# Patient Record
Sex: Female | Born: 1990 | Race: Black or African American | Hispanic: No | Marital: Single | Smoking: Current some day smoker
Health system: Southern US, Community
[De-identification: ages and names within clinical notes are randomized; demographics above are authoritative.]

---

## 2013-03-04 ENCOUNTER — Encounter (HOSPITAL_COMMUNITY): Payer: Self-pay | Admitting: Emergency Medicine

## 2013-03-04 DIAGNOSIS — Z3202 Encounter for pregnancy test, result negative: Secondary | ICD-10-CM | POA: Insufficient documentation

## 2013-03-04 DIAGNOSIS — R112 Nausea with vomiting, unspecified: Secondary | ICD-10-CM | POA: Insufficient documentation

## 2013-03-04 DIAGNOSIS — F17200 Nicotine dependence, unspecified, uncomplicated: Secondary | ICD-10-CM | POA: Insufficient documentation

## 2013-03-04 DIAGNOSIS — R1084 Generalized abdominal pain: Secondary | ICD-10-CM | POA: Insufficient documentation

## 2013-03-04 DIAGNOSIS — R630 Anorexia: Secondary | ICD-10-CM | POA: Insufficient documentation

## 2013-03-04 LAB — CBC WITH DIFFERENTIAL/PLATELET
Basophils Relative: 0 % (ref 0–1)
HCT: 38.5 % (ref 36.0–46.0)
Hemoglobin: 13.1 g/dL (ref 12.0–15.0)
Lymphocytes Relative: 26 % (ref 12–46)
Lymphs Abs: 2.6 10*3/uL (ref 0.7–4.0)
MCHC: 34 g/dL (ref 30.0–36.0)
Monocytes Absolute: 0.4 10*3/uL (ref 0.1–1.0)
Monocytes Relative: 4 % (ref 3–12)
Neutro Abs: 7.1 10*3/uL (ref 1.7–7.7)
Neutrophils Relative %: 69 % (ref 43–77)
Platelets: 286 10*3/uL (ref 150–400)
RBC: 4.29 MIL/uL (ref 3.87–5.11)
WBC: 10.3 10*3/uL (ref 4.0–10.5)

## 2013-03-04 LAB — COMPREHENSIVE METABOLIC PANEL
ALT: 15 U/L (ref 0–35)
BUN: 14 mg/dL (ref 6–23)
CO2: 23 mEq/L (ref 19–32)
Calcium: 9.6 mg/dL (ref 8.4–10.5)
Creatinine, Ser: 1.19 mg/dL — ABNORMAL HIGH (ref 0.50–1.10)
GFR calc Af Amer: 76 mL/min — ABNORMAL LOW (ref >90)
GFR calc non Af Amer: 65 mL/min — ABNORMAL LOW (ref >90)
Glucose, Bld: 132 mg/dL — ABNORMAL HIGH (ref 70–99)
Sodium: 138 mEq/L (ref 135–145)
Total Protein: 7.6 g/dL (ref 6.0–8.3)

## 2013-03-04 MED ORDER — ONDANSETRON HCL 4 MG/2ML IJ SOLN
4.0000 mg | Freq: Once | INTRAMUSCULAR | Status: AC
  Administered 2013-03-04: 4 mg via INTRAVENOUS
  Filled 2013-03-04: qty 2

## 2013-03-04 NOTE — ED Notes (Signed)
Presents with generalized abdominal pain, nausea and vomiting began at 6 pm. Dry heaving and unable to hold fluids down. Denies diarrhea.

## 2013-03-05 ENCOUNTER — Encounter (HOSPITAL_COMMUNITY): Payer: Self-pay | Admitting: Emergency Medicine

## 2013-03-05 ENCOUNTER — Emergency Department (HOSPITAL_COMMUNITY)
Admission: EM | Admit: 2013-03-05 | Discharge: 2013-03-05 | Disposition: A | Discharge status: Home / Self Care | Payer: Managed Care, Other (non HMO) | Attending: Emergency Medicine | Admitting: Emergency Medicine

## 2013-03-05 ENCOUNTER — Emergency Department (HOSPITAL_COMMUNITY): Payer: Managed Care, Other (non HMO)

## 2013-03-05 DIAGNOSIS — R112 Nausea with vomiting, unspecified: Secondary | ICD-10-CM

## 2013-03-05 DIAGNOSIS — R109 Unspecified abdominal pain: Secondary | ICD-10-CM

## 2013-03-05 LAB — URINALYSIS, ROUTINE W REFLEX MICROSCOPIC
Glucose, UA: NEGATIVE mg/dL
Ketones, ur: >80 mg/dL — AB
Leukocytes, UA: NEGATIVE
Nitrite: NEGATIVE
pH: 5.5 (ref 5.0–8.0)

## 2013-03-05 LAB — URINE MICROSCOPIC-ADD ON

## 2013-03-05 LAB — POCT PREGNANCY, URINE: Preg Test, Ur: NEGATIVE

## 2013-03-05 LAB — PREGNANCY, URINE: Preg Test, Ur: NEGATIVE

## 2013-03-05 MED ORDER — SODIUM CHLORIDE 0.9 % IV BOLUS (SEPSIS)
1000.0000 mL | Freq: Once | INTRAVENOUS | Status: AC
  Administered 2013-03-05: 1000 mL via INTRAVENOUS

## 2013-03-05 MED ORDER — IOHEXOL 300 MG/ML  SOLN
100.0000 mL | Freq: Once | INTRAMUSCULAR | Status: AC | PRN
  Administered 2013-03-05: 100 mL via INTRAVENOUS

## 2013-03-05 MED ORDER — IOHEXOL 300 MG/ML  SOLN
20.0000 mL | INTRAMUSCULAR | Status: AC
  Administered 2013-03-05: 25 mL via ORAL

## 2013-03-05 MED ORDER — METOCLOPRAMIDE HCL 5 MG/ML IJ SOLN
10.0000 mg | Freq: Once | INTRAMUSCULAR | Status: AC
  Administered 2013-03-05: 10 mg via INTRAVENOUS
  Filled 2013-03-05: qty 2

## 2013-03-05 MED ORDER — ONDANSETRON 8 MG PO TBDP
ORAL_TABLET | ORAL | Status: AC
Start: 2013-03-05 — End: ?

## 2013-03-05 NOTE — ED Notes (Signed)
EDPA at [facility], speaking with & updating pt/family about plan, options, labs.

## 2013-03-05 NOTE — ED Notes (Signed)
Pt sleeping, NAD, calm, father at [facility], CT notified of "pt ready for CT".

## 2013-03-05 NOTE — ED Notes (Signed)
Back from CT, ambulatory to b/r, al;ert, NAD, calm, steady gait.

## 2013-03-05 NOTE — ED Provider Notes (Signed)
CSN: [id number]     Arrival date & time 03/04/13  2057 History   First MD Initiated Contact with Patient 03/05/13 0034     Chief Complaint  Patient presents with  . Abdominal Pain   HPI  History provided by the patient. Patient is a 22 year old female with no significant PMH who presents with complaints of general abdominal pains and cramps as well as nausea and vomiting. Patient reports having decreased appetite all day. Around 6 PM this evening she had occasional sharp abdominal pains primarily around the periumbilical area. This was followed by multiple episodes of nausea and vomiting. She reports that she's been unable to keep down any fluids and has continued vomiting or dry heaving. She did not use any medications or treatment for symptoms. Denies any associated diarrhea. Denies any recent constipation. She traveled within [place] for the holidays but denies any sick contacts. Denies any associated fever, chills or sweats. No urinary complaints. No vaginal bleeding vaginal discharge.     History reviewed. No pertinent past medical history. No past surgical history on file. No family history on file. History  Substance Use Topics  . Smoking status: Current Some Day Smoker    Types: Cigarettes  . Smokeless tobacco: Not on file  . Alcohol Use: Yes   OB History   Grav Para Term Preterm Abortions TAB SAB Ect Mult Living                 Review of Systems  Constitutional: Positive for appetite change. Negative for fever, chills and diaphoresis.  Respiratory: Negative for cough.   Gastrointestinal: Positive for nausea, vomiting and abdominal pain. Negative for diarrhea and constipation.  Genitourinary: Negative for dysuria, frequency, hematuria, flank pain, vaginal bleeding and vaginal discharge.  All other systems reviewed and are negative.    Allergies  Review of patient's allergies indicates no known allergies.  Home Medications  No current outpatient prescriptions  on file. BP 125/68  Pulse 81  Temp(Src) 97.8 F (36.6 C) (Oral)  Resp 18  SpO2 100%  LMP 02/07/2013 Physical Exam  Nursing note and vitals reviewed. Constitutional: She is oriented to person, place, and time. She appears well-developed and well-nourished. No distress.  HENT:  Head: Normocephalic.  Cardiovascular: Normal rate and regular rhythm.   Pulmonary/Chest: Effort normal and breath sounds normal. No respiratory distress. She has no wheezes. She has no rales.  Abdominal: Soft. Bowel sounds are normal. She exhibits no distension and no mass. There is tenderness in the right lower quadrant and periumbilical area. There is no rebound and no guarding.  Neurological: She is alert and oriented to person, place, and time.  Skin: Skin is warm and dry.  Psychiatric: She has a normal mood and affect. Her behavior is normal.    ED Course  Procedures   DIAGNOSTIC STUDIES: Oxygen Saturation is 100% on room air.    COORDINATION OF CARE:  Nursing notes reviewed. Vital signs reviewed. Initial pt interview and examination performed.   12:38 AM-patient seen and evaluated. She is resting appears comfortable in no acute distress. Patient with mild diffuse abdominal tenderness however greater in the right lower quadrant. No peritoneal signs. Discussed work up plan with pt at bedside, which includes lab testing, UA and reevaluation. Pt agrees with plan.   1:20 AM on recheck of the patient she continues to have some focal tenderness on abdominal exam and right lower quadrant. No rebounding or peritoneal signs. She has normal white count. Discussed options for CT  scan at this time patient does wish to rule out possible appendicitis.    patient having some improvement of symptoms. Lab testing and CT scan unremarkable. This time feel patient's symptoms are most likely cause for viral gastroenteritis-type process. She is able to be discharged this time with strict return precautions.   Treatment  plan initiated: Medications  ondansetron [facility]) injection 4 mg (4 mg Intravenous Given 03/04/13 2122)  ondansetron [facility]) injection 4 mg (4 mg Intravenous Given 03/04/13 2204)   Results for orders placed during the hospital encounter of 03/05/13  COMPREHENSIVE METABOLIC PANEL      Result Value Range   Sodium 138  135 - 145 mEq/L   Potassium 3.3 (*) 3.5 - 5.1 mEq/L   Chloride 101  96 - 112 mEq/L   CO2 23  19 - 32 mEq/L   Glucose, Bld 132 (*) 70 - 99 mg/dL   BUN 14  6 - 23 mg/dL   Creatinine, Ser 4.09 (*) 0.50 - 1.10 mg/dL   Calcium 9.6  8.4 - 81.1 mg/dL   Total Protein 7.6  6.0 - 8.3 g/dL   Albumin 4.3  3.5 - 5.2 g/dL   AST 20  0 - 37 U/L   ALT 15  0 - 35 U/L   Alkaline Phosphatase 71  39 - 117 U/L   Total Bilirubin 0.6  0.3 - 1.2 mg/dL   GFR calc non Af Amer 65 (*) >90 mL/min   GFR calc Af Amer 76 (*) >90 mL/min  CBC WITH DIFFERENTIAL      Result Value Range   WBC 10.3  4.0 - 10.5 K/uL   RBC 4.29  3.87 - 5.11 MIL/uL   Hemoglobin 13.1  12.0 - 15.0 g/dL   HCT 91.4  78.2 - 95.6 %   MCV 89.7  78.0 - 100.0 fL   MCH 30.5  26.0 - 34.0 pg   MCHC 34.0  30.0 - 36.0 g/dL   RDW 21.3  08.6 - 57.8 %   Platelets 286  150 - 400 K/uL   Neutrophils Relative % 69  43 - 77 %   Neutro Abs 7.1  1.7 - 7.7 K/uL   Lymphocytes Relative 26  12 - 46 %   Lymphs Abs 2.6  0.7 - 4.0 K/uL   Monocytes Relative 4  3 - 12 %   Monocytes Absolute 0.4  0.1 - 1.0 K/uL   Eosinophils Relative 1  0 - 5 %   Eosinophils Absolute 0.1  0.0 - 0.7 K/uL   Basophils Relative 0  0 - 1 %   Basophils Absolute 0.0  0.0 - 0.1 K/uL  PREGNANCY, URINE      Result Value Range   Preg Test, Ur NEGATIVE  NEGATIVE  URINALYSIS, ROUTINE W REFLEX MICROSCOPIC      Result Value Range   Color, Urine YELLOW  YELLOW   APPearance CLOUDY (*) CLEAR   Specific Gravity, Urine 1.027  1.005 - 1.030   pH 5.5  5.0 - 8.0   Glucose, UA NEGATIVE  NEGATIVE mg/dL   Hgb urine dipstick LARGE (*) NEGATIVE   Bilirubin Urine NEGATIVE  NEGATIVE    Ketones, ur >80 (*) NEGATIVE mg/dL   Protein, ur NEGATIVE  NEGATIVE mg/dL   Urobilinogen, UA 0.2  0.0 - 1.0 mg/dL   Nitrite NEGATIVE  NEGATIVE   Leukocytes, UA NEGATIVE  NEGATIVE  URINE MICROSCOPIC-ADD ON      Result Value Range   Squamous Epithelial / LPF MANY (*)  RARE   RBC / HPF TOO NUMEROUS TO COUNT  <3 RBC/hpf   Bacteria, UA RARE  RARE  POCT PREGNANCY, URINE      Result Value Range   Preg Test, Ur NEGATIVE  NEGATIVE      Imaging Review Ct Abdomen Pelvis W Contrast  03/05/2013   CLINICAL DATA:  Generalized abdominal pain and vomiting.  EXAM: CT ABDOMEN AND PELVIS WITH CONTRAST  TECHNIQUE: Multidetector CT imaging of the abdomen and pelvis was performed using the standard protocol following bolus administration of intravenous contrast.  CONTRAST:  OMNIPAQUE IOHEXOL 300 MG/ML  SOLN  COMPARISON:  None available for comparison at time of study interpretation.  FINDINGS: Included view of the lung bases are clear. Visualized heart and pericardium are unremarkable.  The liver demonstrates minimal hypodensity about the falciform ligament suggesting focal fatty infiltration is otherwise unremarkable. Spleen, gallbladder, pancreas and adrenal glands are unremarkable.  The stomach, small and large bowel are normal in course and caliber without inflammatory changes. Normal contrast filled appendix. Trace free fluid in the pelvis is likely physiologic. No intraperitoneal free air.  Kidneys are orthotopic, demonstrating symmetric enhancement without nephrolithiasis, hydronephrosis or renal masses. The unopacified ureters are normal in course and caliber. Delayed imaging through the kidneys demonstrates symmetric prompt excretion to the proximal urinary collecting system. Urinary bladder is partially distended and unremarkable.  Great vessels are normal in course and caliber. No lymphadenopathy by CT size criteria. Internal reproductive organs are unremarkable. The soft tissues and included osseous  structures are nonsuspicious.  IMPRESSION: No acute intra-abdominal or pelvic process.  Normal appendix.   Electronically Signed   By: Awilda Metro   On: 03/05/2013 03:57      MDM   1. Abdominal pain   2. Nausea & vomiting         Angus Seller, [place] 03/06/13 [phone]

## 2013-03-05 NOTE — ED Notes (Signed)
C/o L flank area pain, also some RLQ tenderness with palpation, denies nausea at this time. Alert, NAD, calm, interactive, resps e/u, speaking in clear complete sentences.

## 2013-03-05 NOTE — ED Notes (Signed)
To CT via stretcher

## 2013-03-17 NOTE — ED Provider Notes (Signed)
Medical screening examination/treatment/procedure(s) were performed by non-physician practitioner and as supervising physician I was immediately available for consultation/collaboration.     Brandt LoosenJulie Manly, MD 03/17/13 0040

## 2014-12-05 IMAGING — CT CT ABD-PELV W/ CM
2 of 4 series · 14 of 46 positions shown, 16 images · IV contrast (APPLIED)
Comparison: None available for comparison at time of study
interpretation.

CLINICAL DATA: Generalized abdominal pain and vomiting.

EXAM:
CT ABDOMEN AND PELVIS WITH CONTRAST
TECHNIQUE: Multidetector CT imaging of the abdomen and pelvis was performed
using the standard protocol following bolus administration of
intravenous contrast.
CONTRAST:  100mL OMNIPAQUE IOHEXOL 300 MG/ML  SOLN

[Series 2: abd/ pelvis 5.0 i30f 1 · axial · 0.60mm/px · z∈[+916,+1301]mm · 11 of 93 slices shown, 13 images]
[im 8/93  soft-tissue]
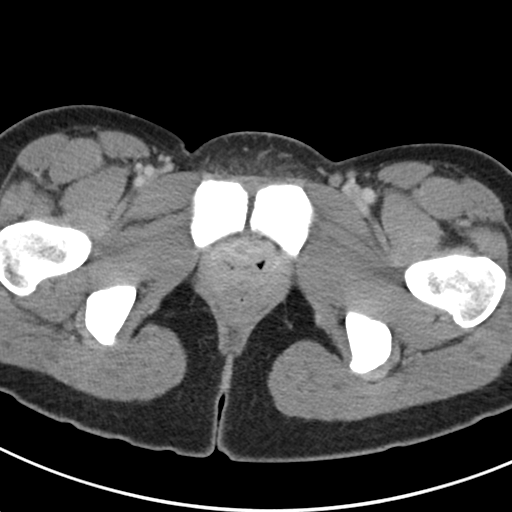
[im 8/93  bone]
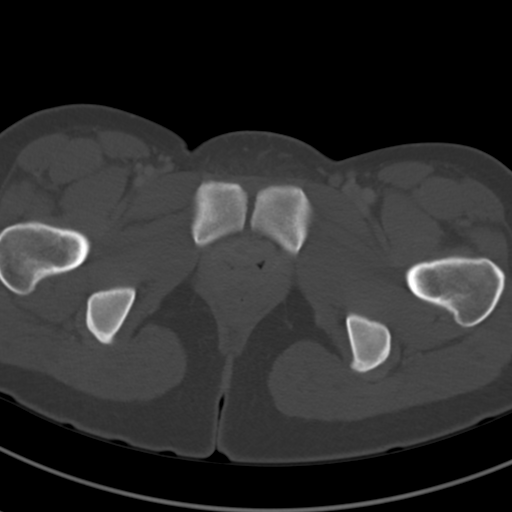
[im 15/93  soft-tissue]
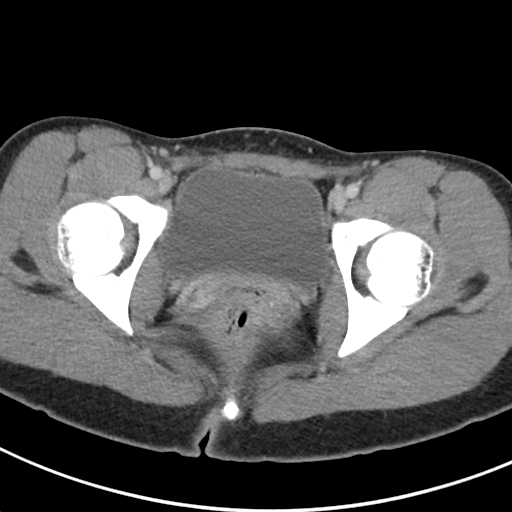
[im 22/93  soft-tissue]
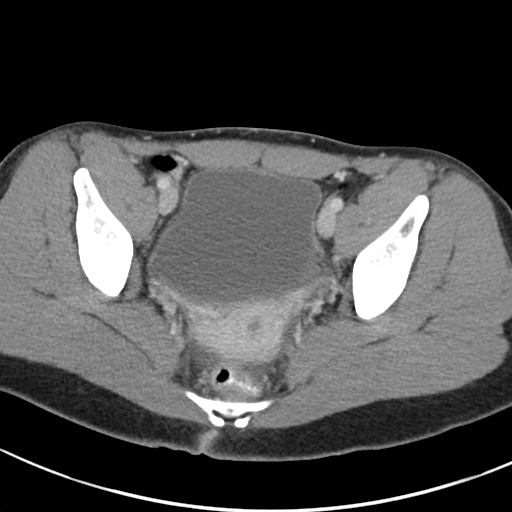
[im 29/93  soft-tissue]
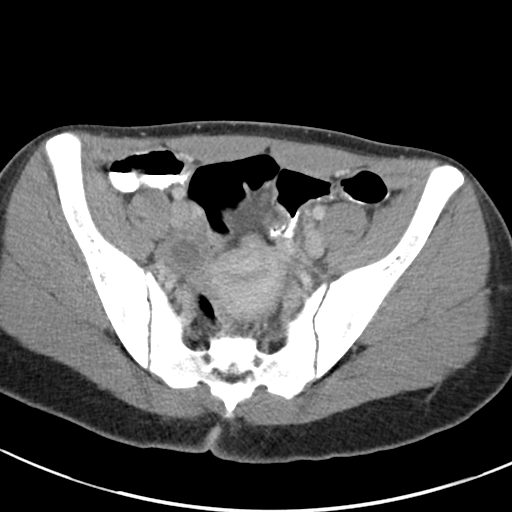
[im 39/93  soft-tissue]
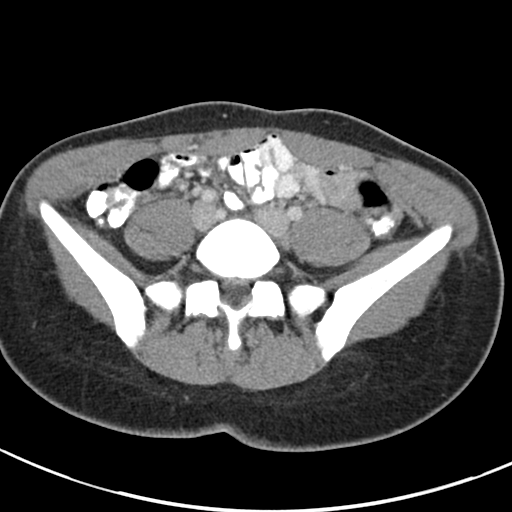
[im 47/93  soft-tissue]
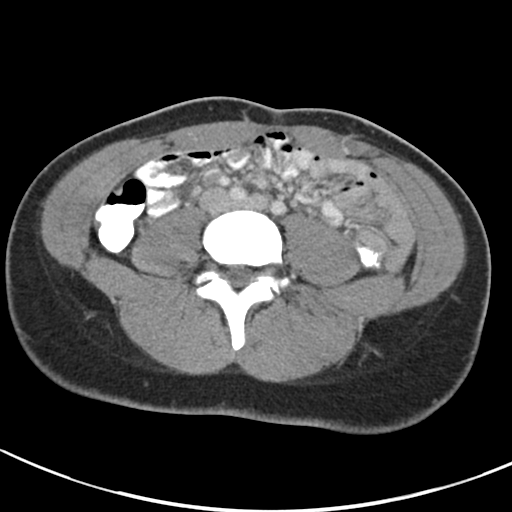
[im 54/93  soft-tissue]
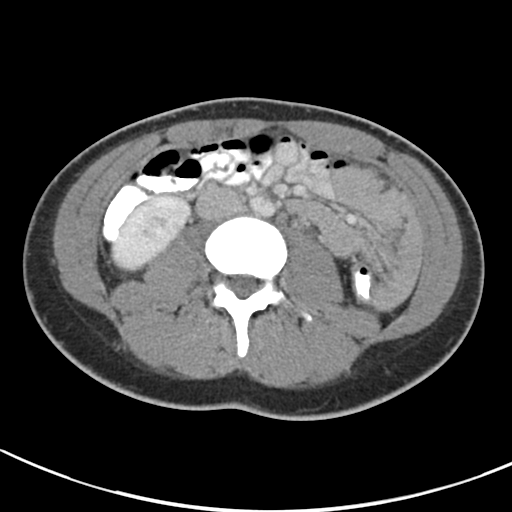
[im 64/93  soft-tissue]
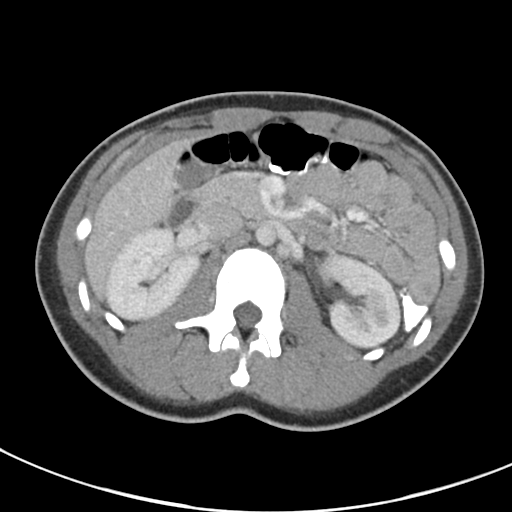
[im 71/93  soft-tissue]
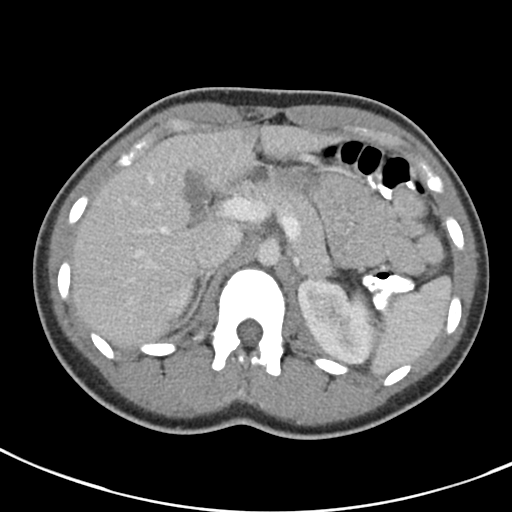
[im 71/93  bone]
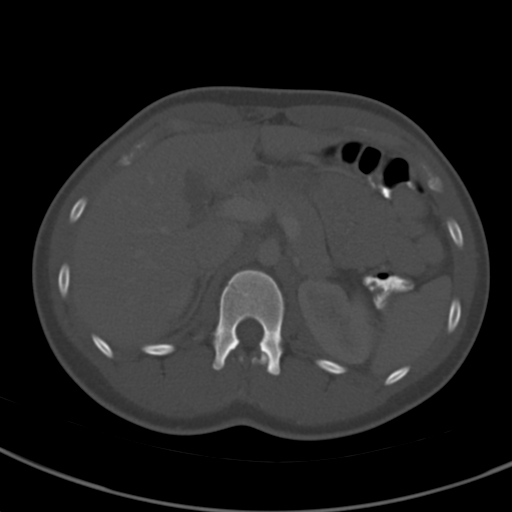
[im 78/93  soft-tissue]
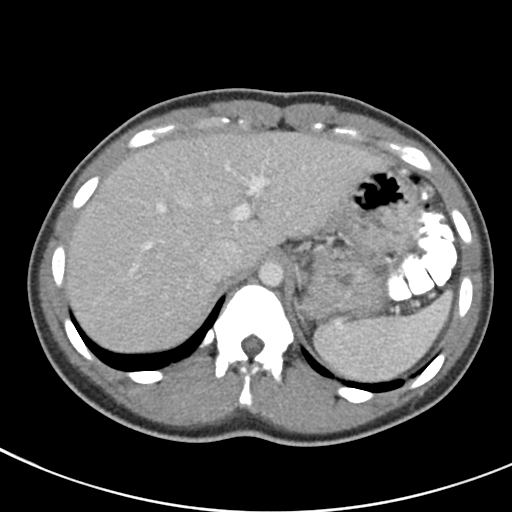
[im 85/93  soft-tissue]
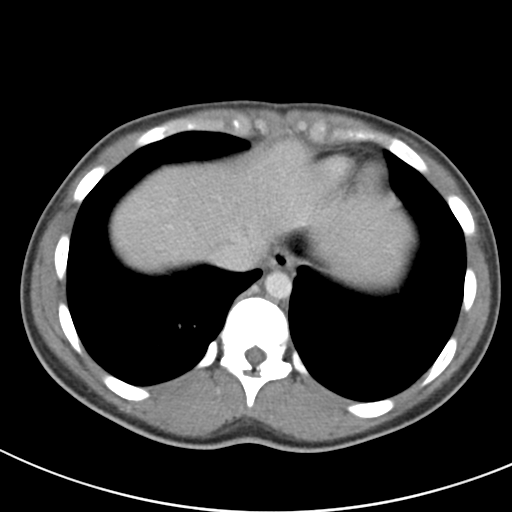

[Series 5: cor · coronal · 0.61mm/px · 3 of 86 slices shown]
[im 29/86  soft-tissue]
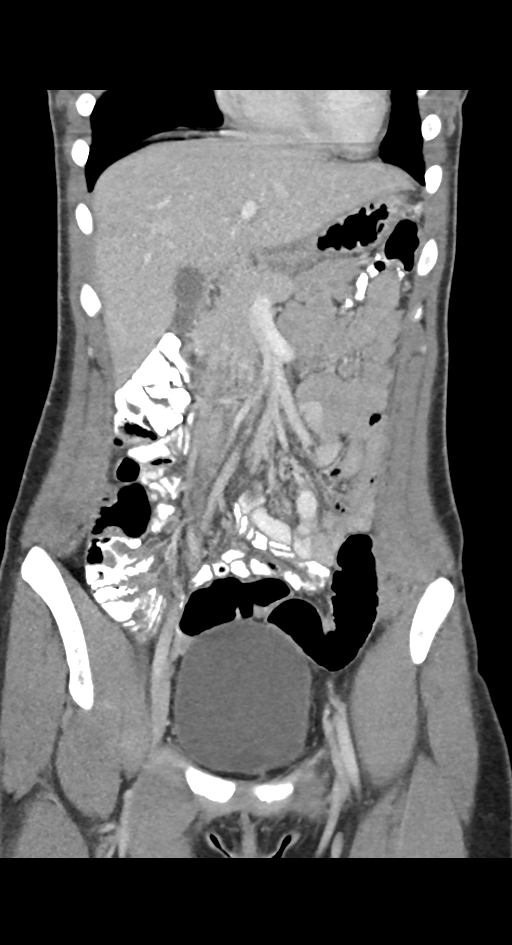
[im 38/86  soft-tissue]
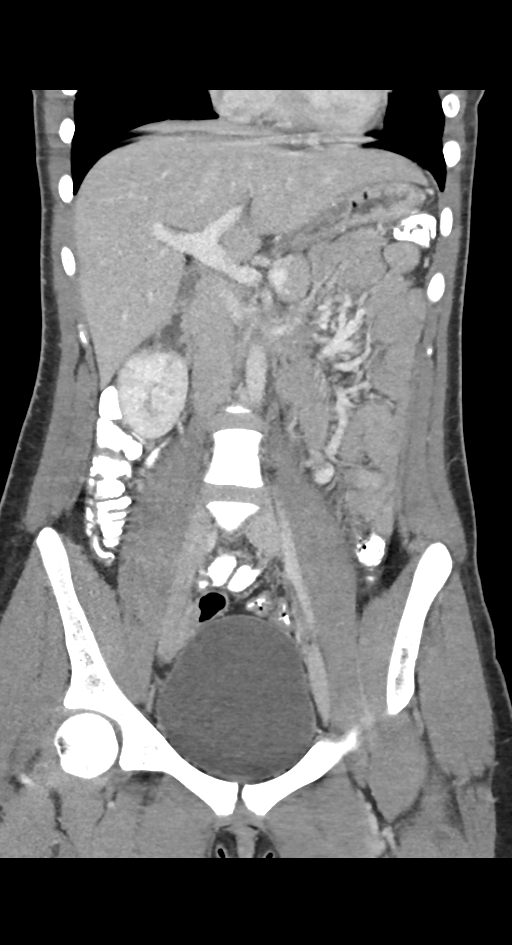
[im 48/86  soft-tissue]
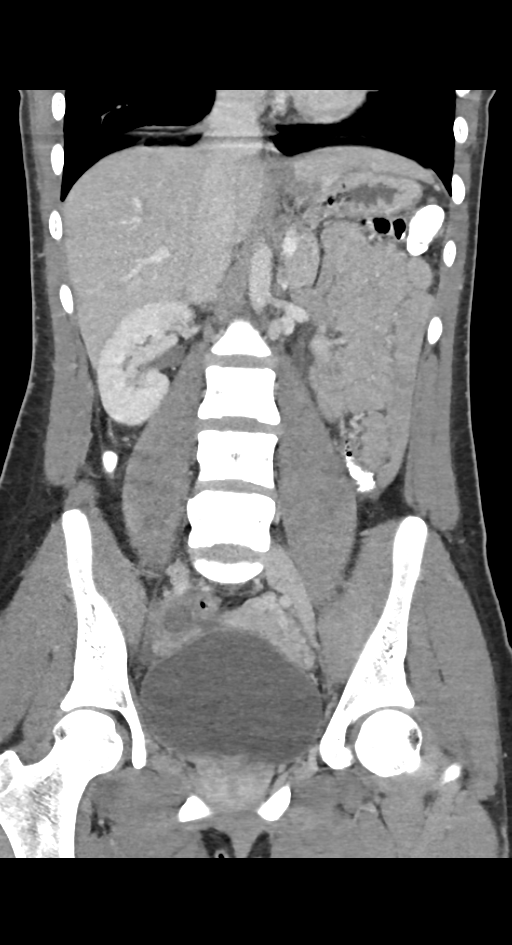

[14 of 46 positions shown; findings below may reference images not displayed]

FINDINGS: Included view of the lung bases are clear. Visualized heart and
pericardium are unremarkable.

The liver demonstrates minimal hypodensity about the falciform
ligament suggesting focal fatty infiltration is otherwise
unremarkable. Spleen, gallbladder, pancreas and adrenal glands are
unremarkable.

The stomach, small and large bowel are normal in course and caliber
without inflammatory changes. Normal contrast filled appendix. Trace
free fluid in the pelvis is likely physiologic. No intraperitoneal
free air.

Kidneys are orthotopic, demonstrating symmetric enhancement without
nephrolithiasis, hydronephrosis or renal masses. The unopacified
ureters are normal in course and caliber. Delayed imaging through
the kidneys demonstrates symmetric prompt excretion to the proximal
urinary collecting system. Urinary bladder is partially distended
and unremarkable.

Great vessels are normal in course and caliber. No lymphadenopathy
by CT size criteria. Internal reproductive organs are unremarkable.
The soft tissues and included osseous structures are nonsuspicious.
IMPRESSION: No acute intra-abdominal or pelvic process.  Normal appendix.

  By: Hege Elisabeth Bolland
